# Patient Record
Sex: Female | Born: 1995 | Race: White | Hispanic: No | Smoking: Never smoker
Health system: Southern US, Community
[De-identification: ages and names within clinical notes are randomized; demographics above are authoritative.]

## PROBLEM LIST (undated history)

## (undated) DIAGNOSIS — L700 Acne vulgaris: Secondary | ICD-10-CM

## (undated) DIAGNOSIS — Q249 Congenital malformation of heart, unspecified: Secondary | ICD-10-CM

## (undated) DIAGNOSIS — R1314 Dysphagia, pharyngoesophageal phase: Secondary | ICD-10-CM

## (undated) DIAGNOSIS — J45909 Unspecified asthma, uncomplicated: Secondary | ICD-10-CM

## (undated) HISTORY — DX: Unspecified asthma, uncomplicated: J45.909

## (undated) HISTORY — DX: Acne vulgaris: L70.0

## (undated) HISTORY — DX: Dysphagia, pharyngoesophageal phase: R13.14

## (undated) HISTORY — DX: Congenital malformation of heart, unspecified: Q24.9

## (undated) HISTORY — PX: NO PAST SURGERIES: SHX2092

---

## 2015-06-15 DIAGNOSIS — R2230 Localized swelling, mass and lump, unspecified upper limb: Secondary | ICD-10-CM | POA: Insufficient documentation

## 2015-06-30 DIAGNOSIS — L989 Disorder of the skin and subcutaneous tissue, unspecified: Secondary | ICD-10-CM | POA: Insufficient documentation

## 2016-01-25 DIAGNOSIS — L700 Acne vulgaris: Secondary | ICD-10-CM | POA: Insufficient documentation

## 2016-01-25 DIAGNOSIS — Z3041 Encounter for surveillance of contraceptive pills: Secondary | ICD-10-CM | POA: Insufficient documentation

## 2017-09-26 DIAGNOSIS — R1314 Dysphagia, pharyngoesophageal phase: Secondary | ICD-10-CM | POA: Insufficient documentation

## 2018-07-28 DIAGNOSIS — J4530 Mild persistent asthma, uncomplicated: Secondary | ICD-10-CM | POA: Insufficient documentation

## 2021-04-10 ENCOUNTER — Other Ambulatory Visit: Payer: Self-pay

## 2021-04-10 DIAGNOSIS — L700 Acne vulgaris: Secondary | ICD-10-CM | POA: Insufficient documentation

## 2021-04-10 DIAGNOSIS — R1314 Dysphagia, pharyngoesophageal phase: Secondary | ICD-10-CM | POA: Insufficient documentation

## 2021-04-10 DIAGNOSIS — J45909 Unspecified asthma, uncomplicated: Secondary | ICD-10-CM | POA: Insufficient documentation

## 2021-04-11 ENCOUNTER — Encounter: Payer: Self-pay | Admitting: Cardiology

## 2021-04-11 ENCOUNTER — Ambulatory Visit (INDEPENDENT_AMBULATORY_CARE_PROVIDER_SITE_OTHER): Payer: 59 | Admitting: Cardiology

## 2021-04-11 ENCOUNTER — Other Ambulatory Visit: Payer: Self-pay

## 2021-04-11 DIAGNOSIS — R002 Palpitations: Secondary | ICD-10-CM | POA: Insufficient documentation

## 2021-04-11 DIAGNOSIS — R0789 Other chest pain: Secondary | ICD-10-CM | POA: Diagnosis not present

## 2021-04-11 DIAGNOSIS — Q262 Total anomalous pulmonary venous connection: Secondary | ICD-10-CM | POA: Insufficient documentation

## 2021-04-11 NOTE — Patient Instructions (Addendum)
Medication Instructions:  Your physician recommends that you continue on your current medications as directed. Please refer to the Current Medication list given to you today.  *If you need a refill on your cardiac medications before your next appointment, please call your pharmacy*   Lab Work: None If you have labs (blood work) drawn today and your tests are completely normal, you will receive your results only by: MyChart Message (if you have MyChart) OR A paper copy in the mail If you have any lab test that is abnormal or we need to change your treatment, we will call you to review the results.   Testing/Procedures: Your physician has requested that you have a cardiac MRI. Cardiac MRI uses a computer to create images of your heart as its beating, producing both still and moving pictures of your heart and major blood vessels. For further information please visit [email or web address]. Please follow the instruction sheet given to you today for more information.  Your physician has requested that you have an echocardiogram. Echocardiography is a painless test that uses sound waves to create images of your heart. It provides your doctor with information about the size and shape of your heart and how well your heart's chambers and valves are working. This procedure takes approximately one hour. There are no restrictions for this procedure.    Follow-Up: At [facility], you and your health needs are our priority.  As part of our continuing mission to provide you with exceptional heart care, we have created designated Provider Care Teams.  These Care Teams include your primary Cardiologist (physician) and Advanced Practice Providers (APPs -  Physician Assistants and Nurse Practitioners) who all work together to provide you with the care you need, when you need it.  We recommend signing up for the patient portal called "MyChart".  Sign up information is provided on this After Visit Summary.   MyChart is used to connect with patients for Virtual Visits (Telemedicine).  Patients are able to view lab/test results, encounter notes, upcoming appointments, etc.  Non-urgent messages can be sent to your provider as well.   To learn more about what you can do with MyChart, go to [email or web address].    Your next appointment:   6 week(s)  The format for your next appointment:   In Person  Provider:   Gypsy Balsam, MD   Other Instructions Nuclear Medicine Exam A nuclear medicine exam is a safe and painless imaging test. It helps your health care provider detect and diagnose diseases. It also provides informationabout the ways your organs work and how they are structured. For a nuclear medicine exam, you will be given a radioactive material, called a tracer, that is absorbed by your body's organs. A large scanning machine detects the radioactive tracer and creates pictures of the areas that yourhealth care provider wants to know more about. There are several kinds of nuclear medicine exams. They include the following: CT scan. MRI scan. PET scan. SPECT scan. Tell your health care provider about: Any allergies you have. All medicines you are taking, including vitamins, herbs, eye drops, creams, and over-the-counter medicines. Any problems you or family members have had with anesthetic medicines. Any blood disorders you have. Any surgeries you have had. Any medical conditions you have. Whether you are pregnant, may be pregnant, or are breastfeeding. What are the risks? Generally, this is a safe procedure. However, problems may occur, such as: An allergic reaction to the tracer. This is rare. Exposure to radiation (a small amount).  What happens before the procedure? Medicines Ask your health care provider about: Changing or stopping your regular medicines. This is especially important if you are taking diabetes medicines or blood thinners. Taking medicines such as aspirin  and ibuprofen. These medicines can thin your blood. Do not take these medicines unless your health care provider tells you to take them. Taking over-the-counter medicines, vitamins, herbs, and supplements. General instructions Follow instructions from your health care provider about eating and drinking restrictions. Do not wear jewelry. Wear loose, comfortable clothing. You may be asked to wear a hospital gown for the procedure. Bring previous imaging studies, such as X-rays, with you to the exam if they are available. What happens during the procedure?  An IV may be inserted into one of your veins. You will be asked to lie on a table or sit in a chair. You will be given the radioactive tracer. You may get: A pill or liquid to swallow. An injection. Medicine through your IV. A gas to inhale. A large scanning machine will be used to create images of your body. After the pictures are taken, you may have to wait until your health care provider can make sure that enough images were taken. The procedure may vary among health care providers and hospitals. What can I expect after the procedure? It is up to you to get the results of your procedure. Ask your health care provider, or the department that is doing the procedure, when your results will be ready. Also ask: How will I get my results? What are my treatment options? What other tests do I need? What are my next steps? Follow these instructions at home: Drink enough water to keep your urine pale yellow (6-8 glasses). This helps to remove the radioactive tracer from your body. You may return to your normal activities as told by your health care provider. Get help right away if you: Have problems breathing. This symptom may represent a serious problem that is an emergency. Do not wait to see if the symptoms will go away. Get medical help right away. Call your local emergency services (911 in the [place] Do not drive yourself to the  hospital. Summary A nuclear medicine exam is a safe and painless imaging test. It provides information about how your organs are working. It is also used to detect and diagnose diseases. During the procedure, you will be given a radioactive tracer. A large scanning machine will create images of your body. You may resume your normal activities after the procedure. Follow your health care provider's instructions. Get help right away if you have problems breathing. This information is not intended to replace advice given to you by your health care provider. Make sure you discuss any questions you have with your healthcare provider. Document Revised: 12/26/2019 Document Reviewed: 12/26/2019 Elsevier Patient Education  2022 [employer].

## 2021-04-11 NOTE — Progress Notes (Signed)
Cardiology Consultation:    Date:  04/11/2021   ID:  [name], DOB December 25, 1995, MRN [id number]  PCP:  Yolanda Manges, FNP  Cardiologist:  Gypsy Balsam, MD   Referring MD: Yolanda Manges, FNP   Chief Complaint  Patient presents with   Chest Pain    History of Present Illness:    Molly Cox is a 25 y.o. female who is being seen today for the evaluation of chest pain at the request of Yolanda Manges, FNP.  She was referred to [place] because of multiple symptoms.  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  Since that time she has been doing poorly she described to have numerous symptoms she does have episodic chest pain.  Those episodes can last for few hours.  Does typically happen at rest.  She did have going to the emergency room recently however no permanent findings at that time.  Previously when she end up in the emergency because of chest pain she got normal troponins however last time when she was notably was not done.  She is able to walk for exercise with no difficulties.  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  She does not exercise on the regular basis because of busy schedule. She used to have palpitations she still described to have some increasing heart rate.  She did wear a monitor which apparently was unrevealing. She is smoking a few cigarettes in her life. She does not know what her cholesterol is. She is does not exercise on a regular basis.  Past Medical History:  Diagnosis Date   Acne vulgaris    Asthma    Congenital heart defect    Pharyngoesophageal dysphagia     Past Surgical History:  Procedure Laterality Date   NO PAST SURGERIES      Current Medications: Current Meds  Medication Sig   desogestrel-ethinyl estradiol (APRI) 0.15-30 MG-MCG  tablet Take 1 tablet by mouth daily.   esomeprazole (NEXIUM) 40 MG capsule Take 40 mg by mouth daily.   tiZANidine (ZANAFLEX) 2 MG tablet Take 2 mg by mouth every 6 (six) hours as needed for spasms.     Allergies:   Patient has no known allergies.   Social History   Socioeconomic History   Marital status: Single    Spouse name: Not on file   Number of children: Not on file   Years of education: Not on file   Highest education level: Not on file  Occupational History   Not on file  Tobacco Use   Smoking status: Never   Smokeless tobacco: Never  Substance and Sexual Activity   Alcohol use: Not Currently   Drug use: Never   Sexual activity: Yes  Other Topics Concern   Not on file  Social History Narrative   Not on file   Social Determinants of Health   Financial Resource Strain: Not on file  Food Insecurity: Not on file  Transportation Needs: Not on file  Physical Activity: Not on file  Stress: Not on file  Social Connections: Not on file     Family History: The patient's family history includes Cancer in her maternal grandmother and paternal grandmother; Clotting disorder in her mother; Diabetes in her paternal grandmother; Hypertension in her  father. ROS:   Please see the history of present illness.    All 14 point review of systems negative except as described per history of present illness.  EKGs/Labs/Other Studies Reviewed:    The following studies were reviewed today: She showed me report of her CT for PE which showed anomalous pulmonary vein return.  Description as above.  EKG:  EKG is  ordered today.  The ekg ordered today demonstrates normal sinus rhythm normal P interval normal QS complex duration morphology  Recent Labs: No results found for requested labs within last 8760 hours.  Recent Lipid Panel No results found for: CHOL, TRIG, HDL, CHOLHDL, VLDL, LDLCALC, LDLDIRECT  Physical Exam:    VS:  BP (!) 148/88 (BP Location: Right Arm, Patient Position:  Sitting)   Pulse (!) 101   Ht 5\' 7"  (1.702 m)   Wt 160 lb (72.6 kg)   SpO2 99%   BMI 25.06 kg/m     Wt Readings from Last 3 Encounters:  04/11/21 160 lb (72.6 kg)     GEN:  Well nourished, well developed in no acute distress HEENT: Normal NECK: No JVD; No carotid bruits LYMPHATICS: No lymphadenopathy CARDIAC: RRR, no murmurs, no rubs, no gallops RESPIRATORY:  Clear to auscultation without rales, wheezing or rhonchi  ABDOMEN: Soft, non-tender, non-distended MUSCULOSKELETAL:  No edema; No deformity  SKIN: Warm and dry NEUROLOGIC:  Alert and oriented x 3 PSYCHIATRIC:  Normal affect   ASSESSMENT:    1. 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 Palpitations   3. Atypical chest pain    PLAN:    In order of problems listed above:  Anomalous pulmonary vein drainage with a left superior pulmonary vein going to innominate.  I will ask her to have an echocardiogram to assess size and function of the right ventricle as well as right atrium.  Luckily her EKG did not show any right ventricular hypertrophy Right bundle branch block.  On top of that she be scheduled to have MRI to precisely measure shunt as well as size of the right ventricle.  Based on that we decide if anything else need to be done about that.  I also want make sure she does not have any anomalous coronary arteries which can go with anomalous pulmonary vein return.  Also will look for potential ASD. Palpitation she did wear monitor apparently it was unrevealing. Atypical chest pain does not look like coronary events.  Those happening at rest at the same time she is able to walk climb stairs with no difficulties.  I do not think he required any CAD work-up right now however MRI will show 06/11/21 if she got any abnormality of her coronary arteries.   Medication Adjustments/Labs and Tests Ordered: Current medicines are reviewed at length with the patient today.  Concerns regarding  medicines are outlined above.  No orders of the defined types were placed in this encounter.  No orders of the defined types were placed in this encounter.   Signed, [place], MD, [facility]. 04/11/2021 4:09 PM    [facility]

## 2021-05-01 ENCOUNTER — Ambulatory Visit (HOSPITAL_COMMUNITY): Payer: 59 | Attending: Cardiovascular Disease

## 2021-05-01 ENCOUNTER — Other Ambulatory Visit: Payer: Self-pay

## 2021-05-01 DIAGNOSIS — Q262 Total anomalous pulmonary venous connection: Secondary | ICD-10-CM | POA: Diagnosis present

## 2021-05-01 LAB — ECHOCARDIOGRAM COMPLETE
Area-P 1/2: 4.57 cm2
S' Lateral: 2.5 cm

## 2021-05-24 ENCOUNTER — Telehealth: Payer: Self-pay

## 2021-05-24 NOTE — Telephone Encounter (Signed)
Medical records from [facility] requested on 04/11/2021 received and on chart under media.

## 2021-06-12 DIAGNOSIS — Q249 Congenital malformation of heart, unspecified: Secondary | ICD-10-CM | POA: Insufficient documentation

## 2021-06-20 ENCOUNTER — Ambulatory Visit (INDEPENDENT_AMBULATORY_CARE_PROVIDER_SITE_OTHER): Payer: 59 | Admitting: Cardiology

## 2021-06-20 ENCOUNTER — Other Ambulatory Visit: Payer: Self-pay

## 2021-06-20 ENCOUNTER — Encounter: Payer: Self-pay | Admitting: Cardiology

## 2021-06-20 VITALS — BP 155/105 | HR 114 | Wt 168.0 lb

## 2021-06-20 DIAGNOSIS — J4530 Mild persistent asthma, uncomplicated: Secondary | ICD-10-CM

## 2021-06-20 DIAGNOSIS — Q262 Total anomalous pulmonary venous connection: Secondary | ICD-10-CM

## 2021-06-20 DIAGNOSIS — R0789 Other chest pain: Secondary | ICD-10-CM | POA: Diagnosis not present

## 2021-06-20 DIAGNOSIS — R002 Palpitations: Secondary | ICD-10-CM | POA: Diagnosis not present

## 2021-06-20 MED ORDER — METOPROLOL TARTRATE 25 MG PO TABS: 25.0000 mg | ORAL_TABLET | Freq: Two times a day (BID) | ORAL | 1 refills | Status: DC

## 2021-06-20 NOTE — Progress Notes (Signed)
Cardiology Office Note:    Date:  06/20/2021   ID:  Molly Cox, DOB 09-08-1995, MRN [id number]  PCP:  Molly Manges, FNP  Cardiologist:  Molly Balsam, MD    Referring MD: Molly Manges, FNP   No chief complaint on file. Still having some problems  History of Present Illness:    Molly Cox is a 25 y.o. 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  She does have a lot of symptomatology which include fatigue tiredness shortness of breath as well as palpitations.  She also noted to have high blood pressure.  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  She did have echocardiogram done which showed normal right ventricle and right atrial size.  However in my opinion she still need to have MRI trying to clarify exactly shunt as well as look for potential additional heart issues including intracardiac shunt or animals coronary arteries.  So far MRI has not been done.  Past Medical History:  Diagnosis Date   Acne vulgaris    Asthma    Congenital heart defect    Pharyngoesophageal dysphagia     Past Surgical History:  Procedure Laterality Date   NO PAST SURGERIES      Current Medications: Current Meds  Medication Sig   desogestrel-ethinyl estradiol (APRI) 0.15-30 MG-MCG tablet Take 1 tablet by mouth daily.   esomeprazole (NEXIUM) 40 MG capsule Take 40 mg by mouth daily.   metoprolol tartrate (LOPRESSOR) 25 MG tablet Take 1 tablet (25 mg total) by mouth 2 (two) times daily.   tiZANidine (ZANAFLEX) 2 MG tablet Take 2 mg by mouth every 6 (six) hours as needed for spasms.     Allergies:   Patient has no known allergies.   Social History   Socioeconomic History   Marital status: Single    Spouse name: Not on file   Number of children: Not on file   Years of education: Not on file   Highest education level: Not on file   Occupational History   Not on file  Tobacco Use   Smoking status: Never   Smokeless tobacco: Never  Substance and Sexual Activity   Alcohol use: Not Currently   Drug use: Never   Sexual activity: Yes  Other Topics Concern   Not on file  Social History Narrative   Not on file   Social Determinants of Health   Financial Resource Strain: Not on file  Food Insecurity: Not on file  Transportation Needs: Not on file  Physical Activity: Not on file  Stress: Not on file  Social Connections: Not on file     Family History: The patient's family history includes Cancer in her maternal grandmother and paternal grandmother; Clotting disorder in her mother; Diabetes in her paternal grandmother; Hypertension in her father. ROS:   Please see the history of present illness.    All 14 point review of systems negative except as described per history of present illness  EKGs/Labs/Other Studies Reviewed:      Recent Labs: No results found for requested labs within last 8760 hours.  Recent Lipid Panel No results found for: CHOL, TRIG, HDL, CHOLHDL, VLDL, LDLCALC, LDLDIRECT  Physical Exam:    VS:  BP (!) 155/105   Pulse (!) 114   Wt 168 lb (76.2 kg)   SpO2 99%   BMI 26.31 kg/m  Wt Readings from Last 3 Encounters:  06/20/21 168 lb (76.2 kg)  04/11/21 160 lb (72.6 kg)     GEN:  Well nourished, well developed in no acute distress HEENT: Normal NECK: No JVD; No carotid bruits LYMPHATICS: No lymphadenopathy CARDIAC: RRR, no murmurs, no rubs, no gallops RESPIRATORY:  Clear to auscultation without rales, wheezing or rhonchi  ABDOMEN: Soft, non-tender, non-distended MUSCULOSKELETAL:  No edema; No deformity  SKIN: Warm and dry LOWER EXTREMITIES: no swelling NEUROLOGIC:  Alert and oriented x 3 PSYCHIATRIC:  Normal affect   ASSESSMENT:    1. 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 Mild persistent asthma without complication    3. Palpitations   4. Atypical chest pain    PLAN:    In order of problems listed above:  Animals pulmonary vein return we will schedule her to have MRI to specifically assess degree of shunt as well as look for potential car morbidities.  We will make arrangements for that Tachycardia with some palpitations I will give her a very small dose of beta-blocker only 12.5 metoprolol twice daily with instruction to take it on as-needed basis.  I also told her that if she plans to get pregnant or if she is pregnant she is to stop the medication. Atypical chest pain very atypical characteristics I doubt very much this coronary artery disease but we need to rule out anomalous coronary arteries which we will do by doing MRI.   Medication Adjustments/Labs and Tests Ordered: Current medicines are reviewed at length with the patient today.  Concerns regarding medicines are outlined above.  No orders of the defined types were placed in this encounter.  Medication changes:  Meds ordered this encounter  Medications   metoprolol tartrate (LOPRESSOR) 25 MG tablet    Sig: Take 1 tablet (25 mg total) by mouth 2 (two) times daily.    Dispense:  180 tablet    Refill:  1    Signed, Georgeanna Lea, MD, [facility] 06/20/2021 5:02 PM    [facility]

## 2021-06-20 NOTE — Patient Instructions (Signed)
Medication Instructions:  Your physician has recommended you make the following change in your medication:  START: Metoprolol tartrate 25 mg twice daily.   *If you need a refill on your cardiac medications before your next appointment, please call your pharmacy*   Lab Work: None If you have labs (blood work) drawn today and your tests are completely normal, you will receive your results only by: MyChart Message (if you have MyChart) OR A paper copy in the mail If you have any lab test that is abnormal or we need to change your treatment, we will call you to review the results.   Testing/Procedures: None   Follow-Up: At [facility], you and your health needs are our priority.  As part of our continuing mission to provide you with exceptional heart care, we have created designated Provider Care Teams.  These Care Teams include your primary Cardiologist (physician) and Advanced Practice Providers (APPs -  Physician Assistants and Nurse Practitioners) who all work together to provide you with the care you need, when you need it.  We recommend signing up for the patient portal called "MyChart".  Sign up information is provided on this After Visit Summary.  MyChart is used to connect with patients for Virtual Visits (Telemedicine).  Patients are able to view lab/test results, encounter notes, upcoming appointments, etc.  Non-urgent messages can be sent to your provider as well.   To learn more about what you can do with MyChart, go to [email or web address].    Your next appointment:   2 month(s)  The format for your next appointment:   In Person  Provider:   Gypsy Balsam, MD   Other Instructions  Metoprolol Tablets What is this medication? METOPROLOL (me TOE proe lole) treats high blood pressure. It also prevents chest pain (angina) or further damage after a heart attack. It works by lowering your blood pressure and heart rate, making it easier for your heart to pump blood  to the rest of your body. It belongs to a group of medications called beta blockers. This medicine may be used for other purposes; ask your health care provider or pharmacist if you have questions. COMMON BRAND NAME(S): Lopressor What should I tell my care team before I take this medication? They need to know if you have any of these conditions: Diabetes Heart or vessel disease like slow heart rate, worsening heart failure, heart block, sick sinus syndrome, or Raynaud's disease Kidney disease Liver disease Lung or breathing disease, like asthma or emphysema Pheochromocytoma Thyroid disease An unusual or allergic reaction to metoprolol, other beta blockers, medications, foods, dyes, or preservatives Pregnant or trying to get pregnant Breast-feeding How should I use this medication? Take this medication by mouth with water. Take it as directed on the prescription label at the same time every day. You can take it with or without food. You should always take it the same way. Keep taking it unless your care team tells you to stop. Talk to your care team about the use of this medication in children. Special care may be needed. Overdosage: If you think you have taken too much of this medicine contact a poison control center or emergency room at once. NOTE: This medicine is only for you. Do not share this medicine with others. What if I miss a dose? If you miss a dose, take it as soon as you can. If it is almost time for your next dose, take only that dose. Do not take double or extra doses.  What may interact with this medication? This medication may interact with the following: Certain medications for blood pressure, heart disease, irregular heartbeat Certain medications for depression like monoamine oxidase (MAO) inhibitors, fluoxetine, or paroxetine Clonidine Dobutamine Epinephrine Isoproterenol Reserpine This list may not describe all possible interactions. Give your health care provider a  list of all the medicines, herbs, non-prescription drugs, or dietary supplements you use. Also tell them if you smoke, drink alcohol, or use illegal drugs. Some items may interact with your medicine. What should I watch for while using this medication? Visit your care team for regular checks on your progress. Check your blood pressure as directed. Ask your care team what your blood pressure should be. Also, find out when you should contact them. Do not treat yourself for coughs, colds, or pain while you are using this medication without asking your care team for advice. Some medications may increase your blood pressure. You may get drowsy or dizzy. Do not drive, use machinery, or do anything that needs mental alertness until you know how this medication affects you. Do not stand up or sit up quickly, especially if you are an older patient. This reduces the risk of dizzy or fainting spells. Alcohol may interfere with the effect of this medication. Avoid alcoholic drinks. This medication may increase blood sugar. Ask your care team if changes in diet or medications are needed if you have diabetes. What side effects may I notice from receiving this medication? Side effects that you should report to your care team as soon as possible: Allergic reactions-skin rash, itching, hives, swelling of the face, lips, tongue, or throat Heart failure-shortness of breath, swelling of the ankles, feet, or hands, sudden weight gain, unusual weakness or fatigue Low blood pressure-dizziness, feeling faint or lightheaded, blurry vision Raynaud's-cool, numb, or painful fingers or toes that may change color from pale, to blue, to red Slow heartbeat-dizziness, feeling faint or lightheaded, confusion, trouble breathing, unusual weakness or fatigue Worsening mood, feelings of depression Side effects that usually do not require medical attention (report to your care team if they continue or are bothersome): Change in sex drive  or performance Diarrhea Dizziness Fatigue Headache This list may not describe all possible side effects. Call your doctor for medical advice about side effects. You may report side effects to FDA at 1-800-FDA-1088. Where should I keep my medication? Keep out of the reach of children and pets. Store at room temperature between 15 and 30 degrees C (59 and 86 degrees F). Protect from moisture. Keep the container tightly closed. Throw away any unused medication after the expiration date. NOTE: This sheet is a summary. It may not cover all possible information. If you have questions about this medicine, talk to your doctor, pharmacist, or health care provider.  2022 Elsevier/Gold Standard (2020-09-22 13:08:14)

## 2021-06-21 NOTE — Addendum Note (Signed)
Addended by: Hazle Quant on: 06/21/2021 10:00 AM   Modules accepted: Orders

## 2021-06-24 ENCOUNTER — Other Ambulatory Visit (HOSPITAL_BASED_OUTPATIENT_CLINIC_OR_DEPARTMENT_OTHER): Payer: 59

## 2021-07-21 ENCOUNTER — Telehealth (HOSPITAL_COMMUNITY): Payer: Self-pay | Admitting: Emergency Medicine

## 2021-07-21 NOTE — Telephone Encounter (Signed)
Attempted to call patient regarding upcoming cardiac MR appointment. Left message on voicemail with name and callback number Yaritsa Savarino RN Navigator Cardiac Imaging [facility] [phone] Office [phone] Cell  

## 2021-07-24 ENCOUNTER — Ambulatory Visit (HOSPITAL_COMMUNITY)
Admission: RE | Admit: 2021-07-24 | Discharge: 2021-07-24 | Disposition: A | Discharge status: Home / Self Care | Payer: 59 | Source: Ambulatory Visit | Attending: Cardiology | Admitting: Cardiology

## 2021-07-24 ENCOUNTER — Other Ambulatory Visit: Payer: Self-pay

## 2021-07-24 DIAGNOSIS — R0789 Other chest pain: Secondary | ICD-10-CM

## 2021-07-24 DIAGNOSIS — R002 Palpitations: Secondary | ICD-10-CM

## 2021-07-24 DIAGNOSIS — Q262 Total anomalous pulmonary venous connection: Secondary | ICD-10-CM | POA: Insufficient documentation

## 2021-07-24 DIAGNOSIS — Q264 Anomalous pulmonary venous connection, unspecified: Secondary | ICD-10-CM

## 2021-07-24 DIAGNOSIS — J4530 Mild persistent asthma, uncomplicated: Secondary | ICD-10-CM | POA: Diagnosis present

## 2021-07-24 MED ORDER — GADOBUTROL 1 MMOL/ML IV SOLN
9.0000 mL | Freq: Once | INTRAVENOUS | Status: AC | PRN
  Administered 2021-07-24: 9 mL via INTRAVENOUS

## 2021-08-22 ENCOUNTER — Ambulatory Visit (INDEPENDENT_AMBULATORY_CARE_PROVIDER_SITE_OTHER): Payer: 59 | Admitting: Cardiology

## 2021-08-22 ENCOUNTER — Encounter: Payer: Self-pay | Admitting: Cardiology

## 2021-08-22 ENCOUNTER — Other Ambulatory Visit: Payer: Self-pay

## 2021-08-22 VITALS — BP 118/76 | HR 108 | Ht 67.0 in | Wt 169.0 lb

## 2021-08-22 DIAGNOSIS — Q262 Total anomalous pulmonary venous connection: Secondary | ICD-10-CM | POA: Diagnosis not present

## 2021-08-22 DIAGNOSIS — R0789 Other chest pain: Secondary | ICD-10-CM | POA: Diagnosis not present

## 2021-08-22 DIAGNOSIS — R002 Palpitations: Secondary | ICD-10-CM | POA: Diagnosis not present

## 2021-08-22 NOTE — Patient Instructions (Signed)

## 2021-08-22 NOTE — Progress Notes (Signed)
Cardiology Office Note:    Date:  08/22/2021   ID:  Molly Cox, DOB [birth year], MRN [id number]  PCP:  Yolanda Manges, FNP  Cardiologist:  Gypsy Balsam, MD    Referring MD: Yolanda Manges, FNP   Chief Complaint  Patient presents with   MRI results    History of Present Illness:    Molly Cox is a 25 y.o. 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  She did have a lot of symptomatology which include fatigue tiredness some shortness of breath palpitations she was noted to be in sinus tachycardia almost all the time.  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  She does not have much time off.  She eventually end up having MRI which showed hemodynamically insignificant shunt with shunt 1-1.3.  No enlargement of the right ventricle and right atrium.  Overall she seems to be doing quite okay except symptomatology that is very nonspecific she describes episode of chest pain that happen when she was laying down and watching her favorite TV show.  Apparently was stopping she ended up going to the emergency room and she work-up was unrevealing.  Past Medical History:  Diagnosis Date   Acne vulgaris    Asthma    Congenital heart defect    Pharyngoesophageal dysphagia     Past Surgical History:  Procedure Laterality Date   NO PAST SURGERIES      Current Medications: Current Meds  Medication Sig   desogestrel-ethinyl estradiol (APRI) 0.15-30 MG-MCG tablet Take 1 tablet by mouth daily.   esomeprazole (NEXIUM) 40 MG capsule Take 40 mg by mouth daily.   metoprolol tartrate (LOPRESSOR) 25 MG tablet Take 1 tablet (25 mg total) by mouth 2 (two) times daily.   tiZANidine (ZANAFLEX) 2 MG tablet Take 2 mg by mouth every 6 (six) hours as needed for spasms.     Allergies:   Patient has no known allergies.   Social History   Socioeconomic History   Marital  status: Single    Spouse name: Not on file   Number of children: Not on file   Years of education: Not on file   Highest education level: Not on file  Occupational History   Not on file  Tobacco Use   Smoking status: Never   Smokeless tobacco: Never  Substance and Sexual Activity   Alcohol use: Not Currently   Drug use: Never   Sexual activity: Yes  Other Topics Concern   Not on file  Social History Narrative   Not on file   Social Determinants of Health   Financial Resource Strain: Not on file  Food Insecurity: Not on file  Transportation Needs: Not on file  Physical Activity: Not on file  Stress: Not on file  Social Connections: Not on file     Family History: The patient's family history includes Cancer in her maternal grandmother and paternal grandmother; Clotting disorder in her mother; Diabetes in her paternal grandmother; Hypertension in her father. ROS:   Please see the history of present illness.    All 14 point review of systems negative except as described per history of present illness  EKGs/Labs/Other Studies Reviewed:      Recent Labs: No results found for requested labs within last 8760 hours.  Recent Lipid Panel No results found for: CHOL, TRIG, HDL, CHOLHDL, VLDL, LDLCALC, LDLDIRECT  Physical Exam:  VS:  BP 118/76 (BP Location: Left Arm, Patient Position: Sitting)    Pulse (!) 108    Ht 5\' 7"  (1.702 m)    Wt 169 lb (76.7 kg)    SpO2 99%    BMI 26.47 kg/m     Wt Readings from Last 3 Encounters:  08/22/21 169 lb (76.7 kg)  06/20/21 168 lb (76.2 kg)  04/11/21 160 lb (72.6 kg)     GEN:  Well nourished, well developed in no acute distress HEENT: Normal NECK: No JVD; No carotid bruits LYMPHATICS: No lymphadenopathy CARDIAC: RRR, no murmurs, no rubs, no gallops RESPIRATORY:  Clear to auscultation without rales, wheezing or rhonchi  ABDOMEN: Soft, non-tender, non-distended MUSCULOSKELETAL:  No edema; No deformity  SKIN: Warm and dry LOWER  EXTREMITIES: no swelling NEUROLOGIC:  Alert and oriented x 3 PSYCHIATRIC:  Normal affect   ASSESSMENT:    1. 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 Palpitations   3. Atypical chest pain    PLAN:    In order of problems listed above:  Anomalous pulmonary vein return.  Extremities shunt is in significant hemodynamically.  We will continue monitoring I see her back in my office 6 months or sooner if she has a problem Palpitations: That being follow-up managed with beta-blocker which I will continue. Atypical chest pain which is not related to exercise.  I asked her to get Maalox and Mylanta and try to see if it helps.  She takes already omeprazole for her GERD apparently.   Medication Adjustments/Labs and Tests Ordered: Current medicines are reviewed at length with the patient today.  Concerns regarding medicines are outlined above.  No orders of the defined types were placed in this encounter.  Medication changes: No orders of the defined types were placed in this encounter.   Signed, 06/11/21, MD, [facility] 08/22/2021 2:56 PM    [facility]

## 2022-12-21 ENCOUNTER — Ambulatory Visit: Payer: 59 | Attending: Cardiology | Admitting: Cardiology

## 2022-12-21 ENCOUNTER — Encounter: Payer: Self-pay | Admitting: Cardiology

## 2022-12-21 VITALS — BP 134/74 | HR 99 | Ht 66.0 in | Wt 159.0 lb

## 2022-12-21 DIAGNOSIS — R002 Palpitations: Secondary | ICD-10-CM | POA: Diagnosis not present

## 2022-12-21 DIAGNOSIS — R0789 Other chest pain: Secondary | ICD-10-CM | POA: Diagnosis not present

## 2022-12-21 DIAGNOSIS — R0609 Other forms of dyspnea: Secondary | ICD-10-CM | POA: Diagnosis not present

## 2022-12-21 DIAGNOSIS — Q264 Anomalous pulmonary venous connection, unspecified: Secondary | ICD-10-CM | POA: Diagnosis not present

## 2022-12-21 MED ORDER — METOPROLOL TARTRATE 25 MG PO TABS: 25.0000 mg | ORAL_TABLET | Freq: Two times a day (BID) | ORAL | 3 refills | Status: DC

## 2022-12-21 NOTE — Patient Instructions (Addendum)
Medication Instructions:  Your physician recommends that you continue on your current medications as directed. Please refer to the Current Medication list given to you today.  *If you need a refill on your cardiac medications before your next appointment, please call your pharmacy*   Lab Work: None Ordered If you have labs (blood work) drawn today and your tests are completely normal, you will receive your results only by: MyChart Message (if you have MyChart) OR A paper copy in the mail If you have any lab test that is abnormal or we need to change your treatment, we will call you to review the results.   Testing/Procedures: Your physician has requested that you have an echocardiogram. Echocardiography is a painless test that uses sound waves to create images of your heart. It provides your doctor with information about the size and shape of your heart and how well your heart's chambers and valves are working. This procedure takes approximately one hour. There are no restrictions for this procedure. Please do NOT wear cologne, perfume, aftershave, or lotions (deodorant is allowed). Please arrive 15 minutes prior to your appointment time.  Exercise Stress Test: Eat a light breakfast and dress to exercise    Follow-Up: At [facility], you and your health needs are our priority.  As part of our continuing mission to provide you with exceptional heart care, we have created designated Provider Care Teams.  These Care Teams include your primary Cardiologist (physician) and Advanced Practice Providers (APPs -  Physician Assistants and Nurse Practitioners) who all work together to provide you with the care you need, when you need it.  We recommend signing up for the patient portal called "MyChart".  Sign up information is provided on this After Visit Summary.  MyChart is used to connect with patients for Virtual Visits (Telemedicine).  Patients are able to view lab/test results, encounter notes,  upcoming appointments, etc.  Non-urgent messages can be sent to your provider as well.   To learn more about what you can do with MyChart, go to [email or web address].    Your next appointment:   2 month(s)  The format for your next appointment:   In Person  Provider:   Gypsy Balsam, MD    Other Instructions NA

## 2022-12-21 NOTE — Progress Notes (Unsigned)
Cardiology Office Note:    Date:  12/21/2022   ID:  Molly Cox, DOB Dec 09, 1995, MRN [id number]  PCP:  Yolanda Manges, FNP  Cardiologist:  Gypsy Balsam, MD    Referring MD: Yolanda Manges, FNP   Chief Complaint  Patient presents with   Chest Pain   elevated HR    History of Present Illness:    Molly Cox is a 27 y.o. 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  She did have MRI done which showed child 1.3/1.  Negligible.  She comes back to my office with a constellation of atypical symptoms she complain of having some chest pain, fullness that she feels that happen typically when she lay down flat at night.  She also complain of her heart rate being fast and her Apple Watch actually alarmed her about heart rate being elevated.  He is try to be a little more active try to exercise on the regular basis actually enjoying it but afraid to walk too much.  Because of elevated heart rate.  She does have diagnosed of anxiety and is being managed with medications as well as she does have psychotherapist  Past Medical History:  Diagnosis Date   Acne vulgaris    Asthma    Congenital heart defect    Pharyngoesophageal dysphagia     Past Surgical History:  Procedure Laterality Date   NO PAST SURGERIES      Current Medications: Current Meds  Medication Sig   buPROPion (WELLBUTRIN XL) 300 MG 24 hr tablet Take 300 mg by mouth daily.   busPIRone (BUSPAR) 15 MG tablet Take 15 mg by mouth 3 (three) times daily.   cholecalciferol (VITAMIN D3) 25 MCG (1000 UNIT) tablet Take 1,000 Units by mouth daily.   desogestrel-ethinyl estradiol (APRI) 0.15-30 MG-MCG tablet Take 1 tablet by mouth daily.   esomeprazole (NEXIUM) 40 MG capsule Take 40 mg by mouth daily.   lamoTRIgine (LAMICTAL) 150 MG tablet Take 300 mg by mouth daily.   metoprolol tartrate (LOPRESSOR) 25 MG  tablet Take 1 tablet (25 mg total) by mouth 2 (two) times daily.   tiZANidine (ZANAFLEX) 2 MG tablet Take 2 mg by mouth every 6 (six) hours as needed for spasms.     Allergies:   Patient has no known allergies.   Social History   Socioeconomic History   Marital status: Significant Other    Spouse name: Not on file   Number of children: Not on file   Years of education: Not on file   Highest education level: Not on file  Occupational History   Not on file  Tobacco Use   Smoking status: Never   Smokeless tobacco: Never  Substance and Sexual Activity   Alcohol use: Not Currently   Drug use: Never   Sexual activity: Yes  Other Topics Concern   Not on file  Social History Narrative   Not on file   Social Determinants of Health   Financial Resource Strain: Not on file  Food Insecurity: Not on file  Transportation Needs: Not on file  Physical Activity: Not on file  Stress: Not on file  Social Connections: Not on file     Family History: The patient's family history includes Cancer in her maternal grandmother and paternal grandmother; Clotting disorder in her mother; Diabetes in her paternal grandmother; Hypertension in her father. ROS:   Please see the history  of present illness.    All 14 point review of systems negative except as described per history of present illness  EKGs/Labs/Other Studies Reviewed:      Recent Labs: No results found for requested labs within last 365 days.  Recent Lipid Panel No results found for: "CHOL", "TRIG", "HDL", "CHOLHDL", "VLDL", "LDLCALC", "LDLDIRECT"  Physical Exam:    VS:  BP 134/74 (BP Location: Left Arm, Patient Position: Sitting)   Pulse 99   Ht  (1.676 m)   Wt 159 lb (72.1 kg)   SpO2 99%   BMI 25.66 kg/m     Wt Readings from Last 3 Encounters:  12/21/22 159 lb (72.1 kg)  08/22/21 169 lb (76.7 kg)  06/20/21 168 lb (76.2 kg)     GEN:  Well nourished, well developed in no acute distress HEENT: Normal NECK: No  JVD; No carotid bruits LYMPHATICS: No lymphadenopathy CARDIAC: RRR, no murmurs, no rubs, no gallops RESPIRATORY:  Clear to auscultation without rales, wheezing or rhonchi  ABDOMEN: Soft, non-tender, non-distended MUSCULOSKELETAL:  No edema; No deformity  SKIN: Warm and dry LOWER EXTREMITIES: no swelling NEUROLOGIC:  Alert and oriented x 3 PSYCHIATRIC:  Normal affect   ASSESSMENT:    1. 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 Atypical chest pain   3. Palpitations    PLAN:    In order of problems listed above:  Anomalous pulmonary vein return MRI showing only a small shunt.  She does have some concerning symptoms echocardiogram will be done to make sure the right side is not enlarged. Atypical chest pain I will schedule her to have plain treadmill EKG stress test.  Doubt very much with dealing with any coronary disease but I think she needs some encouragement that everything is fine before pushing her I will be harder with exercises. Palpitation we will continue monitoring in the future she may required monitor.   Medication Adjustments/Labs and Tests Ordered: Current medicines are reviewed at length with the patient today.  Concerns regarding medicines are outlined above.  No orders of the defined types were placed in this encounter.  Medication changes: No orders of the defined types were placed in this encounter.   Signed, Georgeanna Lea, MD, [facility] 12/21/2022 10:04 AM    [facility]

## 2023-01-14 ENCOUNTER — Encounter (HOSPITAL_COMMUNITY): Payer: 59

## 2023-01-29 ENCOUNTER — Ambulatory Visit (HOSPITAL_COMMUNITY): Payer: 59

## 2023-01-29 ENCOUNTER — Ambulatory Visit: Payer: 59 | Admitting: Cardiology

## 2023-02-06 ENCOUNTER — Telehealth (HOSPITAL_COMMUNITY): Payer: Self-pay | Admitting: *Deleted

## 2023-02-06 NOTE — Telephone Encounter (Signed)
Left message with instructions for upcoming test (ETT) on 02/11/23 at 3:30.

## 2023-02-11 ENCOUNTER — Ambulatory Visit (HOSPITAL_BASED_OUTPATIENT_CLINIC_OR_DEPARTMENT_OTHER): Payer: 59

## 2023-02-11 ENCOUNTER — Ambulatory Visit (HOSPITAL_COMMUNITY): Payer: 59 | Attending: Internal Medicine

## 2023-02-11 DIAGNOSIS — R0789 Other chest pain: Secondary | ICD-10-CM

## 2023-02-11 DIAGNOSIS — R0609 Other forms of dyspnea: Secondary | ICD-10-CM | POA: Insufficient documentation

## 2023-02-11 LAB — ECHOCARDIOGRAM COMPLETE
Area-P 1/2: 5.42 cm2
S' Lateral: 2.5 cm

## 2023-02-11 LAB — EXERCISE TOLERANCE TEST
Estimated workload: 12.5
Peak HR: 190 {beats}/min

## 2023-02-12 LAB — EXERCISE TOLERANCE TEST
Angina Index: 1
Base ST Depression (mm): 0 mm
Duke Treadmill Score: 7
Exercise duration (min): 10 min
Exercise duration (sec): 31 s
MPHR: 194 {beats}/min
Percent HR: 97 %
Rest HR: 82 {beats}/min
ST Depression (mm): 0 mm

## 2023-02-13 ENCOUNTER — Telehealth: Payer: Self-pay

## 2023-02-13 NOTE — Telephone Encounter (Signed)
-----   Message from Georgeanna Lea, MD sent at 02/13/2023  1:06 PM EDT ----- Echocardiogram showed preserved left ventricle ejection fraction, overall looks good

## 2023-02-13 NOTE — Telephone Encounter (Signed)
Patient notified through my chart.

## 2023-03-04 ENCOUNTER — Encounter: Payer: Self-pay | Admitting: Cardiology

## 2023-03-04 ENCOUNTER — Ambulatory Visit: Payer: 59 | Attending: Cardiology | Admitting: Cardiology

## 2023-03-04 VITALS — BP 126/80 | HR 97 | Ht 66.5 in | Wt 150.0 lb

## 2023-03-04 DIAGNOSIS — R002 Palpitations: Secondary | ICD-10-CM | POA: Diagnosis not present

## 2023-03-04 DIAGNOSIS — R0789 Other chest pain: Secondary | ICD-10-CM

## 2023-03-04 DIAGNOSIS — Q264 Anomalous pulmonary venous connection, unspecified: Secondary | ICD-10-CM

## 2023-03-04 NOTE — Patient Instructions (Signed)

## 2023-03-04 NOTE — Progress Notes (Signed)
Cardiology Office Note:    Date:  03/04/2023   ID:  Molly Cox, DOB 1996/02/18, MRN [id number]  PCP:  Yolanda Manges, FNP  Cardiologist:  Gypsy Balsam, MD    Referring MD: Yolanda Manges, FNP   Chief Complaint  Patient presents with   Follow-up  Feeling better  History of Present Illness:    Molly Cox is a 27 y.o. 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  Additional problem include asthma, anxiety.  Comes today to months for follow-up.  Overall she says she is doing better she is working right now BG exhausted tired but otherwise fine.  Part of evaluation that we did recently included echocardiogram which showed normal right ventricle size and function, she also had stress that she did quite well on the treadmill and walked more than 10 minutes no difficulties.  Did have some atypical chest pain but no changes on the EKG.  Overall seems to be doing fine.  Denies have any palpitations now no dizziness no passing out.  Past Medical History:  Diagnosis Date   Acne vulgaris    Asthma    Congenital heart defect    Pharyngoesophageal dysphagia     Past Surgical History:  Procedure Laterality Date   NO PAST SURGERIES      Current Medications: Current Meds  Medication Sig   buPROPion (WELLBUTRIN XL) 300 MG 24 hr tablet Take 300 mg by mouth daily.   busPIRone (BUSPAR) 15 MG tablet Take 15 mg by mouth 3 (three) times daily.   cholecalciferol (VITAMIN D3) 25 MCG (1000 UNIT) tablet Take 1,000 Units by mouth daily.   desogestrel-ethinyl estradiol (APRI) 0.15-30 MG-MCG tablet Take 1 tablet by mouth daily.   lamoTRIgine (LAMICTAL) 150 MG tablet Take 300 mg by mouth daily.   metoprolol tartrate (LOPRESSOR) 25 MG tablet Take 1 tablet (25 mg total) by mouth 2 (two) times daily.   tiZANidine (ZANAFLEX) 2 MG tablet Take 2 mg by mouth every 6  (six) hours as needed for spasms.     Allergies:   Patient has no known allergies.   Social History   Socioeconomic History   Marital status: Significant Other    Spouse name: Not on file   Number of children: Not on file   Years of education: Not on file   Highest education level: Not on file  Occupational History   Not on file  Tobacco Use   Smoking status: Never   Smokeless tobacco: Never  Substance and Sexual Activity   Alcohol use: Not Currently   Drug use: Never   Sexual activity: Yes  Other Topics Concern   Not on file  Social History Narrative   Not on file   Social Determinants of Health   Financial Resource Strain: Not on file  Food Insecurity: Not on file  Transportation Needs: Not on file  Physical Activity: Not on file  Stress: Not on file  Social Connections: Not on file     Family History: The patient's family history includes Cancer in her maternal grandmother and paternal grandmother; Clotting disorder in her mother; Diabetes in her paternal grandmother; Hypertension in her father. ROS:   Please see the history of present illness.    All 14 point review of systems negative except as described per history of present illness  EKGs/Labs/Other Studies Reviewed:  Recent Labs: No results found for requested labs within last 365 days.  Recent Lipid Panel No results found for: "CHOL", "TRIG", "HDL", "CHOLHDL", "VLDL", "LDLCALC", "LDLDIRECT"  Physical Exam:    VS:  BP 126/80 (BP Location: Left Arm, Patient Position: Sitting)   Pulse 97   Ht 5' 6.5" (1.689 m)   Wt 150 lb (68 kg)   SpO2 99%   BMI 23.85 kg/m     Wt Readings from Last 3 Encounters:  03/04/23 150 lb (68 kg)  12/21/22 159 lb (72.1 kg)  08/22/21 169 lb (76.7 kg)     GEN:  Well nourished, well developed in no acute distress HEENT: Normal NECK: No JVD; No carotid bruits LYMPHATICS: No lymphadenopathy CARDIAC: RRR, no murmurs, no rubs, no gallops RESPIRATORY:  Clear to  auscultation without rales, wheezing or rhonchi  ABDOMEN: Soft, non-tender, non-distended MUSCULOSKELETAL:  No edema; No deformity  SKIN: Warm and dry LOWER EXTREMITIES: no swelling NEUROLOGIC:  Alert and oriented x 3 PSYCHIATRIC:  Normal affect   ASSESSMENT:    1. 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 Palpitations   3. Atypical chest pain    PLAN:    In order of problems listed above:  Anomalous pulmonary vein drainage.  Stable from that point review right side normal size no pulmonary hypertension continue present management. Palpitations denies having any. Atypical chest pain did well on the treadmill without EKG changes.  We will encourage her to exercise on the regular basis.   Medication Adjustments/Labs and Tests Ordered: Current medicines are reviewed at length with the patient today.  Concerns regarding medicines are outlined above.  No orders of the defined types were placed in this encounter.  Medication changes: No orders of the defined types were placed in this encounter.   Signed, Georgeanna Lea, MD, [facility] 03/04/2023 8:41 AM    [facility]

## 2023-06-12 IMAGING — MR MR CARD MORPHOLOGY WO/W CM
45 of 48 series · 45 of 48 positions shown · IV contrast (Contrast agent)
Comparison: none

CLINICAL DATA: Partial anomalous pulmonary venous return, evaluate
for other congenital heart disease

EXAM:
CARDIAC MRI
TECHNIQUE: The patient was scanned on a 1.5 Tesla GE magnet. A dedicated
cardiac coil was used. Functional imaging was done using Fiesta
sequences. [DATE], and 4 chamber views were done to assess for RWMA's.
Modified Oncel rule using a short axis stack was used to
calculate an ejection fraction on a dedicated work station using
Circle software. The patient received 9mL GADAVIST GADOBUTROL 1
MMOL/ML IV SOLN. After 10 minutes inversion recovery sequences were
used to assess for infiltration and scar tissue.

[Series 4: [id number] · axial · 8.0mm · 1.48mm/px · 1 of 16 slices shown]
[im 1/16]
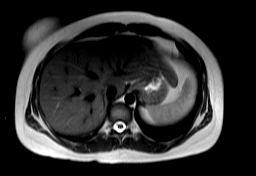

[Series 8: bSSFP · oblique · 8.0mm · 1.61mm/px · 1 of 25 slices shown (1 of 44)]
[im 1/25]
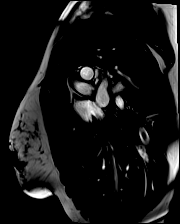

[Series 9: bSSFP · oblique · 8.0mm · 1.61mm/px · 1 of 25 slices shown (2 of 44)]
[im 1/25]
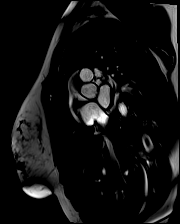

[Series 10: bSSFP · oblique · 8.0mm · 1.61mm/px · 1 of 25 slices shown (3 of 44)]
[im 1/25]
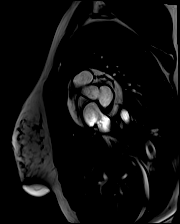

[Series 11: bSSFP · oblique · 8.0mm · 1.61mm/px · 1 of 25 slices shown (4 of 44)]
[im 1/25]
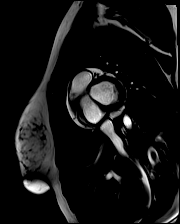

[Series 12: bSSFP · oblique · 8.0mm · 1.61mm/px · 1 of 25 slices shown (5 of 44)]
[im 1/25]
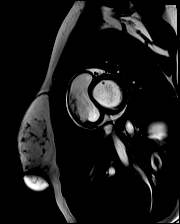

[Series 13: bSSFP · oblique · 8.0mm · 1.61mm/px · 1 of 25 slices shown (6 of 44)]
[im 1/25]
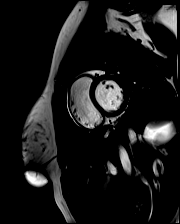

[Series 14: bSSFP · oblique · 8.0mm · 1.61mm/px · 1 of 25 slices shown (7 of 44)]
[im 1/25]
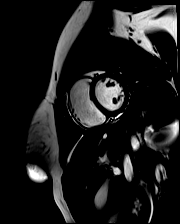

[Series 15: bSSFP · oblique · 8.0mm · 1.61mm/px · 1 of 25 slices shown (8 of 44)]
[im 1/25]
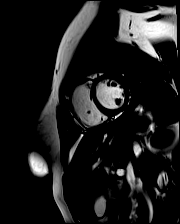

[Series 16: bSSFP · oblique · 8.0mm · 1.61mm/px · 1 of 25 slices shown (9 of 44)]
[im 1/25]
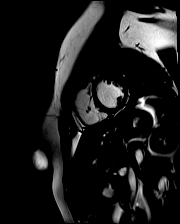

[Series 17: bSSFP · oblique · 8.0mm · 1.61mm/px · 1 of 25 slices shown (10 of 44)]
[im 1/25]
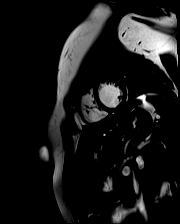

[Series 18: bSSFP · oblique · 8.0mm · 1.61mm/px · 1 of 25 slices shown (11 of 44)]
[im 1/25]
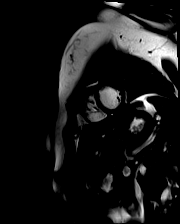

[Series 19: bSSFP · oblique · 8.0mm · 1.61mm/px · 1 of 25 slices shown (12 of 44)]
[im 1/25]
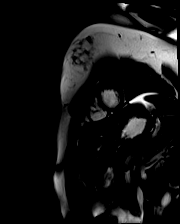

[Series 20: bSSFP · oblique · 8.0mm · 1.61mm/px · 1 of 25 slices shown (13 of 44)]
[im 1/25]
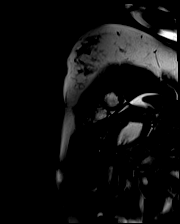

[Series 21: bSSFP · oblique · 8.0mm · 1.61mm/px · 1 of 25 slices shown (14 of 44)]
[im 1/25]
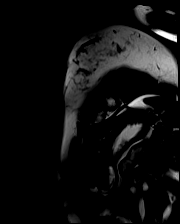

[Series 22: bSSFP · oblique · 8.0mm · 1.61mm/px · 1 of 25 slices shown (15 of 44)]
[im 1/25]
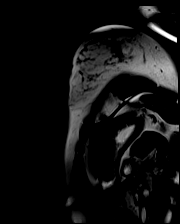

[Series 23: bSSFP · oblique · 8.0mm · 1.61mm/px · 1 of 25 slices shown (16 of 44)]
[im 1/25]
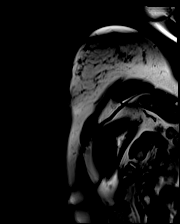

[Series 24: bSSFP · oblique · 6.0mm · 1.41mm/px · 1 of 25 slices shown (17 of 44)]
[im 1/25]
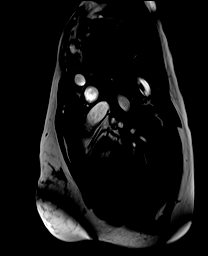

[Series 25: bSSFP · oblique · 6.0mm · 1.41mm/px · 1 of 25 slices shown (18 of 44)]
[im 1/25]
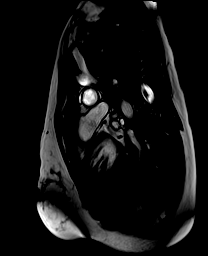

[Series 26: bSSFP · oblique · 6.0mm · 1.41mm/px · 1 of 25 slices shown (19 of 44)]
[im 1/25]
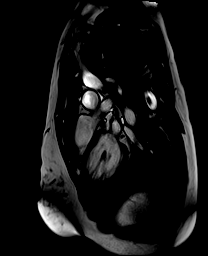

[Series 27: bSSFP · oblique · 6.0mm · 1.41mm/px · 1 of 25 slices shown (20 of 44)]
[im 1/25]
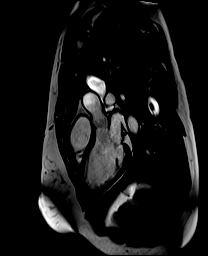

[Series 28: bSSFP · oblique · 6.0mm · 1.41mm/px · 1 of 25 slices shown (21 of 44)]
[im 1/25]
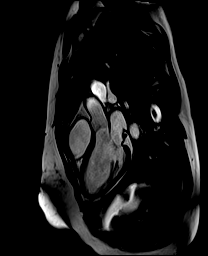

[Series 29: bSSFP · oblique · 6.0mm · 1.41mm/px · 1 of 25 slices shown (22 of 44)]
[im 1/25]
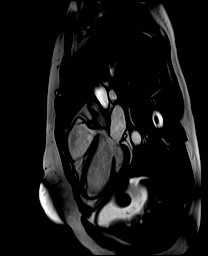

[Series 30: bSSFP · oblique · 6.0mm · 1.41mm/px · 1 of 25 slices shown (23 of 44)]
[im 1/25]
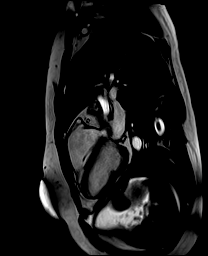

[Series 31: bSSFP · oblique · 6.0mm · 1.41mm/px · 1 of 25 slices shown (24 of 44)]
[im 1/25]
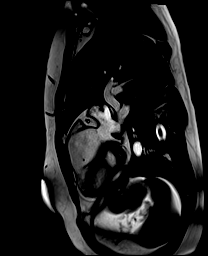

[Series 32: bSSFP · oblique · 6.0mm · 1.41mm/px · 1 of 25 slices shown (25 of 44)]
[im 1/25]
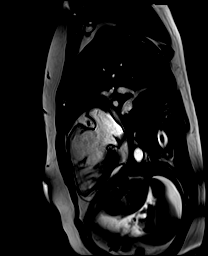

[Series 33: bSSFP · oblique · 6.0mm · 1.41mm/px · 1 of 25 slices shown (26 of 44)]
[im 1/25]
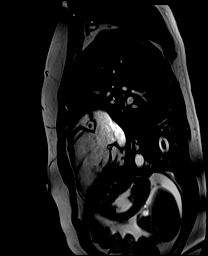

[Series 34: bSSFP · oblique · 6.0mm · 1.41mm/px · 1 of 25 slices shown (27 of 44)]
[im 1/25]
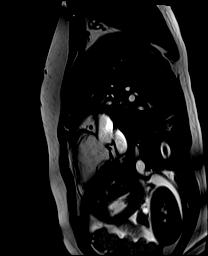

[Series 35: bSSFP · oblique · 6.0mm · 1.41mm/px · 1 of 25 slices shown (28 of 44)]
[im 1/25]
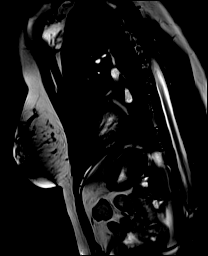

[Series 36: bSSFP · oblique · 6.0mm · 1.41mm/px · 1 of 25 slices shown (29 of 44)]
[im 1/25]
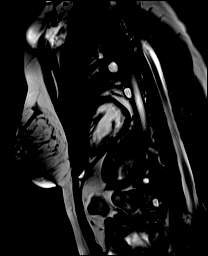

[Series 37: bSSFP · oblique · 6.0mm · 1.41mm/px · 1 of 25 slices shown (30 of 44)]
[im 1/25]
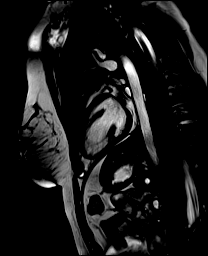

[Series 38: bSSFP · oblique · 6.0mm · 1.41mm/px · 1 of 25 slices shown (31 of 44)]
[im 1/25]
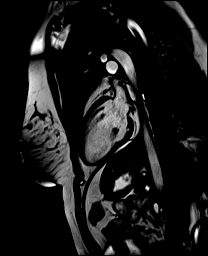

[Series 39: bSSFP · oblique · 6.0mm · 1.41mm/px · 1 of 25 slices shown (32 of 44)]
[im 1/25]
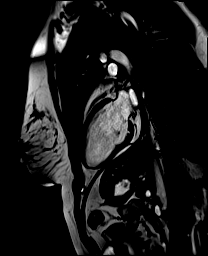

[Series 40: bSSFP · oblique · 6.0mm · 1.41mm/px · 1 of 25 slices shown (33 of 44)]
[im 1/25]
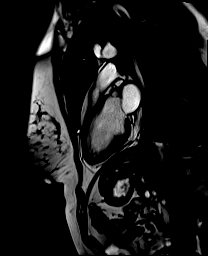

[Series 41: bSSFP · oblique · 6.0mm · 1.41mm/px · 1 of 25 slices shown (34 of 44)]
[im 1/25]
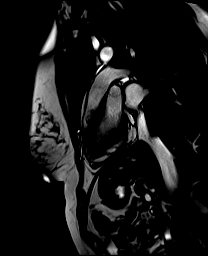

[Series 42: bSSFP · oblique · 6.0mm · 1.41mm/px · 1 of 25 slices shown (35 of 44)]
[im 1/25]
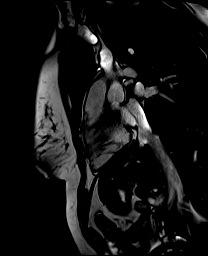

[Series 43: bSSFP · oblique · 6.0mm · 1.41mm/px · 1 of 25 slices shown (36 of 44)]
[im 1/25]
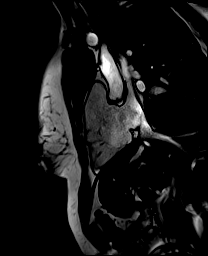

[Series 44: bSSFP · oblique · 6.0mm · 1.41mm/px · 1 of 25 slices shown (37 of 44)]
[im 1/25]
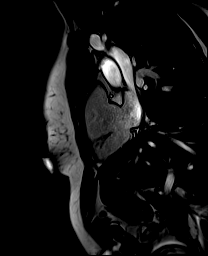

[Series 45: bSSFP · oblique · 6.0mm · 1.41mm/px · 1 of 25 slices shown (38 of 44)]
[im 1/25]
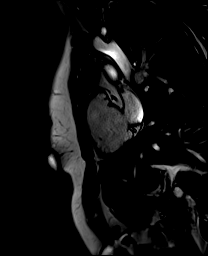

[Series 46: bSSFP · oblique · 6.0mm · 1.41mm/px · 1 of 25 slices shown (39 of 44)]
[im 1/25]
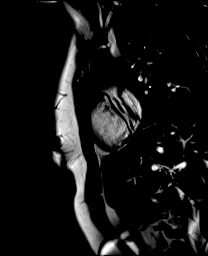

[Series 47: bSSFP · coronal · 6.0mm · 1.41mm/px · 1 of 25 slices shown (40 of 44)]
[im 1/25]
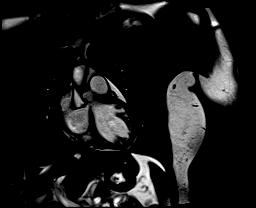

[Series 48: bSSFP · oblique · 6.0mm · 1.41mm/px · 1 of 25 slices shown (41 of 44)]
[im 1/25]
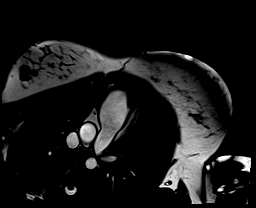

[Series 49: bSSFP · oblique · 6.0mm · 1.41mm/px · 1 of 25 slices shown (42 of 44)]
[im 1/25]
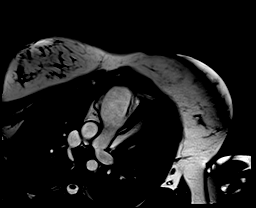

[Series 50: bSSFP · oblique · 6.0mm · 1.41mm/px · 1 of 25 slices shown (43 of 44)]
[im 1/25]
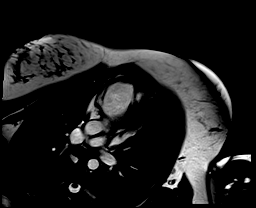

[Series 51: bSSFP · oblique · 6.0mm · 1.41mm/px · 1 of 25 slices shown (44 of 44)]
[im 1/25]
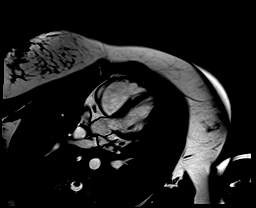

[45 of 48 positions shown; findings below may reference images not displayed]

This examination is tailored for evaluation cardiac anatomy and
function and provides very limited assessment of noncardiac
structures, which are accordingly not evaluated during
interpretation. If there is clinical concern for extracardiac
pathology, further evaluation with CT imaging should be considered.
FINDINGS: LEFT VENTRICLE:

Normal left ventricular chamber size.

Normal left ventricular wall thickness.

Normal left ventricular systolic function.

LVEF = 63%

There are no regional wall motion abnormalities.

No myocardial edema, T2 = 48 msec.

Normal first pass perfusion.

There is no post contrast delayed myocardial enhancement.

Normal T1 myocardial nulling kinetics suggest against a diagnosis of
cardiac amyloidosis.

ECV = 26%

No VSD seen.

RIGHT VENTRICLE:

Normal right ventricular chamber size.

Normal right ventricular wall thickness.

Normal right ventricular systolic function.

RVEF = 56%

There are no regional wall motion abnormalities.

No post contrast delayed myocardial enhancement.

ATRIA:

Normal left atrial size.

Normal right atrial size.

No ASD seen.

VALVES:

No significant valvular abnormalities.  Tricuspid aortic valve.

CORONARY ARTERIES:

Normal coronary artery origins.

PERICARDIUM:

Normal pericardium.  No pericardial effusion.

PULMONARY VEINS:

Normal right sided pulmonary venous drainage.

The left lower pulmonary vein is seen draining in to the left
atrium.

The left upper pulmonary vein is incompletely assessed on this exam
and is described on outside CTA in greater detail.

OTHER: No significant extracardiac findings.

MEASUREMENTS:
Qp/Qs:

Left ventricle:

LV Female

LV EF: 63% (normal 56-78%)

Absolute volumes:

LV EDV: 127mL (Normal 52-141 mL)

LV ESV: 47mL (Normal 13-51 mL)

LV SV: 80mL (Normal 33-97 mL)

CO: 6.2L/min (Normal 2.7-6.0 L/min)

Indexed volumes:

LV EDV: 67mL/sq-m (normal 41-81 mL/sq-m)

LV ESV: 25mL/sq-m (Normal 12-21 mL/sq-m)

LV SV: 42mL/sq-m (Normal 26-56 mL/sq-m)

CI: 3.25L/min/sq-m (Normal 1.8-3.8 L/min/sq-m)

Right ventricle:

RV female

RV EF: 56% (normal 47-80%)

Absolute volumes:

RV EDV: 158 mL (Normal 58-154 mL)

RV ESV: 69 mL (Normal 12-68 mL)

RV SV: 89 mL (Normal 35-98 mL)

CO: 6.9 L/min (Normal 2.7-6 L/min)

Indexed volumes:

RV EDV: 83 ML/sq-m (Normal 48-87 mL/sq-m)

RV ESV: 36 mL/sq-m (Normal 11-28 mL/sq-m)

RV SV: 47 mL/sq-m (Normal 27-57 mL/sq-m)

CI: 3.6 L/min/sq-m (Normal 1.8-3.8 L/min/sq-m)
IMPRESSION: 1. Normal biventricular chamber size and function. LVEF 63%, RVEF
56%.

2. Study performed for previously documented partial anomalous left
upper pulmonary venous return into the left innominate vein, seen
best in series 3 but otherwise incompletely visualized.

3.  Qp/Qs is 1.3, suggesting no significant shunt.

4.  No ASD, VSD or other detectable congenital lesions.

5.  Normal coronary artery origins.

6. No delayed myocardial enhancement or myocardial edema. ECV 26%.
No scar, fibrosis, inflammation, infarction or infiltrative
myocardial process.

## 2024-01-19 ENCOUNTER — Other Ambulatory Visit: Payer: Self-pay | Admitting: Cardiology
